# Patient Record
Sex: Male | Born: 1995 | Race: White | Hispanic: No | Marital: Single | Smoking: Never smoker
Health system: Southern US, Community
[De-identification: ages and names within clinical notes are randomized; demographics above are authoritative.]

## PROBLEM LIST (undated history)

## (undated) DIAGNOSIS — M259 Joint disorder, unspecified: Secondary | ICD-10-CM

## (undated) DIAGNOSIS — K219 Gastro-esophageal reflux disease without esophagitis: Secondary | ICD-10-CM

---
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
## 2016-04-04 ENCOUNTER — Ambulatory Visit (INDEPENDENT_AMBULATORY_CARE_PROVIDER_SITE_OTHER)

## 2016-04-04 ENCOUNTER — Ambulatory Visit
Admission: EM | Admit: 2016-04-04 | Discharge: 2016-04-04 | Disposition: A | Discharge status: Home / Self Care | Attending: Family Medicine | Admitting: Family Medicine

## 2016-04-04 DIAGNOSIS — R52 Pain, unspecified: Secondary | ICD-10-CM

## 2016-04-04 MED ORDER — NAPROXEN 500 MG PO TABS: 500.0000 mg | ORAL_TABLET | Freq: Two times a day (BID) | ORAL | Status: AC

## 2016-04-04 NOTE — ED Notes (Signed)
Patient complains of right leg pain. Patient states that he has shin bone pain. Patient states that he got cleated initially while playing soccer and then had leg to leg contact injury around 2 weeks ago. Patient states that he has tried advil and ice without relief. Patient reports some discomfort with ambulation as well as with touch.

## 2016-04-04 NOTE — ED Provider Notes (Signed)
CSN: [id number]     Arrival date & time 04/04/16  1018 History   First MD Initiated Contact with Patient 04/04/16 1233     Chief Complaint  Patient presents with  . Leg Pain   (Consider location/radiation/quality/duration/timing/severity/associated sxs/prior Treatment) HPI: Patient presents today with symptoms of right leg pain. Patient states that he was cleated below the knee playing soccer about 2 weeks ago. He then got cleated there again a week later. He has taken Advil once and used ice on the area. He denies the area ever being significantly swollen or bruised. He denies any abrasions or redness of the area. He denies any fever or chills. He does state that he has some discomfort and tightness with flexion at the knee and also with ambulation at times. He denies any other injury anywhere else. He denies any knee effusion. He denies any knee injury or surgery in the past.  Past Medical History  Diagnosis Date  . Wrist disorder    Past Surgical History  Procedure Laterality Date  . Wrist surgery Left 2013   History reviewed. No pertinent family history. Social History  Substance Use Topics  . Smoking status: Never Smoker   . Smokeless tobacco: None  . Alcohol Use: No    Review of Systems: Negative except mentioned above.   Allergies  Review of patient's allergies indicates no known allergies.  Home Medications   Prior to Admission medications   Medication Sig Start Date End Date Taking? Authorizing Provider  esomeprazole (NEXIUM) 40 MG capsule Take 40 mg by mouth daily at 12 noon.   Yes Historical Provider, MD  ondansetron (ZOFRAN ODT) 8 MG disintegrating tablet Take 1 tablet (8 mg total) by mouth every 8 (eight) hours as needed for nausea or vomiting. 12/01/15   Payton Mccallumrlando Conty, MD  oseltamivir (TAMIFLU) 75 MG capsule Take 1 capsule (75 mg total) by mouth 2 (two) times daily. 12/01/15   Payton Mccallumrlando Conty, MD   Meds Ordered and Administered this Visit  Medications - No data to  display  BP 119/67 mmHg  Pulse 80  Temp(Src) 97.2 F (36.2 C) (Tympanic)  Resp 16  Ht 5\' 7"  (1.702 m)  Wt 180 lb (81.647 kg)  BMI 28.19 kg/m2  SpO2 99% No data found.   Physical Exam:   GENERAL: NAD HEENT: no pharyngeal erythema, no exudate RESP: CTA B CARD: RRR MSK: no obvious deformity or discoloration, tenderness localized to proximal tibia area laterally and medially to palpation, patella tendon intact, mild tenderness at pes anserine bursa, no knee effusion, -Lachman, -McMurray, no laxity with varus or valgus stress, nv intact  NEURO: CN II-XII grossly intact   ED Course  Procedures (including critical care time)  Labs Review Labs Reviewed - No data to display  Imaging Review No results found.   MDM  A/P: Right leg pain - x-ray negative for fracture, discussed with patient and parent that symptoms could still be related to a bone bruise, subtle fracture, etc bursitis. I have ordered an MRI as an outpatient and told the patient that hopefully we can do this before he leaves on a trip outside of the country next week. If this is unable to be ordered through the urgent care as an outpatient order patient will have to see his primary care physician or orthopedics to have this imaging done. Ice area frequently, NSAIDs when necessary, seek medical attention for any acute worsening symptoms.  Jolene ProvostKirtida Traevon Meiring, MD 04/04/16 1339

## 2016-04-07 ENCOUNTER — Other Ambulatory Visit: Payer: Self-pay | Admitting: Orthopedic Surgery

## 2016-04-07 DIAGNOSIS — M7989 Other specified soft tissue disorders: Secondary | ICD-10-CM

## 2016-04-07 DIAGNOSIS — R29898 Other symptoms and signs involving the musculoskeletal system: Secondary | ICD-10-CM

## 2016-04-07 DIAGNOSIS — S8001XA Contusion of right knee, initial encounter: Secondary | ICD-10-CM

## 2016-04-07 DIAGNOSIS — M7651 Patellar tendinitis, right knee: Secondary | ICD-10-CM

## 2016-05-24 ENCOUNTER — Ambulatory Visit
Admission: EM | Admit: 2016-05-24 | Discharge: 2016-05-24 | Disposition: A | Discharge status: Home / Self Care | Attending: Family Medicine | Admitting: Family Medicine

## 2016-05-24 ENCOUNTER — Encounter: Payer: Self-pay | Admitting: *Deleted

## 2016-05-24 ENCOUNTER — Ambulatory Visit (INDEPENDENT_AMBULATORY_CARE_PROVIDER_SITE_OTHER)

## 2016-05-24 DIAGNOSIS — S92411A Displaced fracture of proximal phalanx of right great toe, initial encounter for closed fracture: Secondary | ICD-10-CM

## 2016-05-24 DIAGNOSIS — S90121A Contusion of right lesser toe(s) without damage to nail, initial encounter: Secondary | ICD-10-CM

## 2016-05-24 HISTORY — DX: Gastro-esophageal reflux disease without esophagitis: K21.9

## 2016-05-24 MED ORDER — OXYCODONE-ACETAMINOPHEN 5-325 MG PO TABS: 1.0000 | ORAL_TABLET | Freq: Three times a day (TID) | ORAL | 0 refills | Status: AC | PRN

## 2016-05-24 NOTE — ED Provider Notes (Signed)
[facility] ____________________________________________  Time seen: Approximately 245 PM  I have reviewed the triage vital signs and the nursing notes.   HISTORY  Chief Complaint Foot Pain   HPI Bryan Page is a 20 y.o. male presents with a complaint of right foot pain post injury yesterday morning. Patient reports he was playing soccer and another player accidentally stepped directly on his right distal foot causing pain to his right great toe. Patient reports pain has persisted. Patient reports no pain if at rest but reports as soon as he tries to apply weight pain is present. Patient reports pain is present to big toe and top of his foot.  Denies pain radiation. Denies any numbness or tingling sensation. Patient reports he has been using crutches at home. Denies head injury or loss of consciousness.  Denies headache, neck pain, back pain, other extremity pain or other extremity injury. Patient reports feels well otherwise.   [facility], MD: PCP    Past Medical History:  Diagnosis Date  . Acid reflux   . Wrist disorder     There are no active problems to display for this patient.   Past Surgical History:  Procedure Laterality Date  . WRIST SURGERY Left 2013    No current facility-administered medications for this encounter.   Current Outpatient Prescriptions:  .  esomeprazole (NEXIUM) 40 MG capsule, Take 40 mg by mouth daily at 12 noon., Disp: , Rfl:  .  naproxen (NAPROSYN) 500 MG tablet, Take 1 tablet (500 mg total) by mouth 2 (two) times daily., Disp: 20 tablet, Rfl: 0 .  ondansetron (ZOFRAN ODT) 8 MG disintegrating tablet, Take 1 tablet (8 mg total) by mouth every 8 (eight) hours as needed for nausea or vomiting., Disp: 6 tablet, Rfl: 0 .  oseltamivir (TAMIFLU) 75 MG capsule, Take 1 capsule (75 mg total) by mouth 2 (two) times daily., Disp: 10 capsule, Rfl: 0 .  oxyCODONE-acetaminophen (ROXICET) 5-325 MG tablet, Take 1 tablet by  mouth every 8 (eight) hours as needed for moderate pain or severe pain (Do not drive or operate heavy machinery while taking as can cause drowsiness.)., Disp: 9 tablet, Rfl: 0  Allergies Review of patient's allergies indicates no known allergies.  History reviewed. No pertinent family history.  Social History Social History  Substance Use Topics  . Smoking status: Never Smoker  . Smokeless tobacco: Never Used  . Alcohol use No    Review of Systems Constitutional: No fever/chills Eyes: No visual changes. ENT: No sore throat. Cardiovascular: Denies chest pain. Respiratory: Denies shortness of breath. Gastrointestinal: No abdominal pain.  No nausea, no vomiting.  No diarrhea.  No constipation. Genitourinary: Negative for dysuria. Musculoskeletal: Negative for back pain. As above.  Skin: Negative for rash. Neurological: Negative for headaches, focal weakness or numbness.  10-point ROS otherwise negative.  ____________________________________________   PHYSICAL EXAM:  VITAL SIGNS: ED Triage Vitals  Enc Vitals Group     BP 05/24/16 1306 106/67     Pulse Rate 05/24/16 1306 87     Resp 05/24/16 1306 18     Temp 05/24/16 1306 98.1 F (36.7 C)     Temp Source 05/24/16 1306 Oral     SpO2 05/24/16 1306 100 %     Weight 05/24/16 1308 180 lb (81.6 kg)     Height 05/24/16 1308 5\' 7"  (1.702 m)     Head Circumference --      Peak Flow --      Pain Score  05/24/16 1313 10     Pain Loc --      Pain Edu? --      Excl. in GC? --     Constitutional: Alert and oriented. Well appearing and in no acute distress. Eyes: Conjunctivae are normal. PERRL. EOMI. ENT      Head: Normocephalic and atraumatic.      Mouth/Throat: Mucous membranes are moist. Cardiovascular: Normal rate, regular rhythm. Grossly normal heart sounds.  Good peripheral circulation. Respiratory: Normal respiratory effort without tachypnea nor retractions. Breath sounds are clear and equal bilaterally. No  wheezes/rales/rhonchi.. Gastrointestinal: Soft and nontender.  Musculoskeletal:  Nontender with normal range of motion in all extremities. No midline cervical, thoracic or lumbar tenderness to palpation. Bilateral pedal pulses equal and easily palpated. Except: right medial foot at proximal first phalanx moderate swelling and ecchymosis present, mild tenderness and swelling at distal second and third metatarsals, right foot otherwise nontender, no pain with right ankle rotation, mild dorsal pain with right foot plantar flexion and dorsiflexion, distal sensation intact, normal distal capillary refill to all right foot distal toes, right lower extremity otherwise nontender. Gait not tested due to pain. Neurologic:  Normal speech and language. No gross focal neurologic deficits are appreciated. Speech is normal.   Skin:  Skin is warm, dry and intact. No rash noted. Psychiatric: Mood and affect are normal. Speech and behavior are normal. Patient exhibits appropriate insight and judgment   ___________________________________________   LABS (all labs ordered are listed, but only abnormal results are displayed)  Labs Reviewed - No data to display  RADIOLOGY  Dg Foot Complete Right  Result Date: 05/24/2016 CLINICAL DATA:  Pain following injury while playing soccer. Cleat penetrated foot EXAM: RIGHT FOOT COMPLETE - 3+ VIEW COMPARISON:  None. FINDINGS: Frontal, oblique, and lateral views were obtained. There is a comminuted fracture along the proximal aspect of the first proximal phalanx with fracture fragments extending into the first MTP joint medially. No other fracture evident. No dislocation. There is soft tissue swelling over the dorsum of the mid the distal foot. No erosive change. IMPRESSION: Comminuted fracture involving the proximal aspect of the first proximal phalanx with several a avulsed fragments medially. Fracture fragments extend of the first MTP joint. No other fracture. No dislocation.  Soft tissue swelling over the dorsal mid to distal foot. Electronically Signed   By: Bretta Bang III M.D.   On: 05/24/2016 13:58   ____________________________________________   PROCEDURES Procedures   Reports has crutches at home. Postoperative shoe applied. Neurovascular intact post application. __________________________________________   INITIAL IMPRESSION / ASSESSMENT AND PLAN / ED COURSE  Pertinent labs & imaging results that were available during my care of the patient were reviewed by me and considered in my medical decision making (see chart for details).  Well-appearing patient. No acute distress. Sitting for the complaints of right foot pain post mechanical injury yesterday when a another soccer player stepped with cleats onto his right foot. Will evaluate x-ray. Denies any other injury.  Per radiologist right foot comminuted fracture involving the proximal aspect of the first phalanx with several avulsed fragments medially, fracture fragments extend to the first MTP joint no other fracture seen, no dislocation, soft tissue swelling. Discussed in detail with patient and father at bedside, concern regarding need to follow-up with podiatry for possible further intervention. Patient reports he has crutches at home. Discussed in detail importance of no weightbearing. Patient states that he will not apply weight and agrees to wear postoperative shoe instead  of splinting due to comfort but states he will not apply weight. Encourage podiatry appointment in 2-3 days. Information given for podiatry. Prescription given for Percocet as needed for breakthrough pain. Encourage over-the-counter ibuprofen, ice, rest and nonweightbearing. Work note given for today and tomorrow.   Discussed follow up with Primary care physician this week. Discussed follow up and return parameters including no resolution or any worsening concerns. Patient verbalized understanding and agreed to plan.    ____________________________________________   FINAL CLINICAL IMPRESSION(S) / ED DIAGNOSES  Final diagnoses:  Closed displaced fracture of proximal phalanx of great toe, right, initial encounter  Contusion of fifth toe of right foot, initial encounter     Discharge Medication List as of 05/24/2016  2:36 PM    START taking these medications   Details  oxyCODONE-acetaminophen (ROXICET) 5-325 MG tablet Take 1 tablet by mouth every 8 (eight) hours as needed for moderate pain or severe pain (Do not drive or operate heavy machinery while taking as can cause drowsiness.)., Starting Mon 05/24/2016, Print        Note: This dictation was prepared with Dragon dictation along with smaller phrase technology. Any transcriptional errors that result from this process are unintentional.    Clinical Course      Renford Dills, NP 05/24/16 1758

## 2016-05-24 NOTE — ED Triage Notes (Signed)
Patient injured his right foot while playing soccer last AM. Swelling is visible on top of right foot with bruising around right big toe.

## 2016-05-24 NOTE — Discharge Instructions (Signed)
Take medication as prescribed. Take over the counter ibuprofen as needed. Apply ice and elevate.Rest. Drink plenty of fluids. No weight wearing. Use crutches.   Follow up with podiatry this week as discussed.    Follow up with your primary care physician this week as needed. Return to Urgent care for new or worsening concerns.

## 2016-06-08 ENCOUNTER — Ambulatory Visit (INDEPENDENT_AMBULATORY_CARE_PROVIDER_SITE_OTHER): Admitting: Gastroenterology

## 2016-06-08 ENCOUNTER — Other Ambulatory Visit: Payer: Self-pay

## 2016-06-08 ENCOUNTER — Encounter: Payer: Self-pay | Admitting: Gastroenterology

## 2016-06-08 VITALS — BP 114/78 | HR 90 | Temp 98.2°F | Ht 67.0 in | Wt 178.0 lb

## 2016-06-08 DIAGNOSIS — IMO0002 Reserved for concepts with insufficient information to code with codable children: Secondary | ICD-10-CM | POA: Insufficient documentation

## 2016-06-08 DIAGNOSIS — K219 Gastro-esophageal reflux disease without esophagitis: Secondary | ICD-10-CM

## 2016-06-08 DIAGNOSIS — E739 Lactose intolerance, unspecified: Secondary | ICD-10-CM | POA: Insufficient documentation

## 2016-06-08 MED ORDER — PANTOPRAZOLE SODIUM 40 MG PO TBEC: 40.0000 mg | DELAYED_RELEASE_TABLET | Freq: Every day | ORAL | 3 refills | Status: DC

## 2016-06-08 NOTE — Progress Notes (Signed)
Gastroenterology Consultation  Referring Provider:     Marina Goodell, MD Primary Care Physician:  Bryan Page, Bryan Guthrie, MD Primary Gastroenterologist:  Dr. Servando Snare     Reason for Consultation:     GERD        HPI:   Bryan Page is a 20 y.o. y/o male referred for consultation & management of GERD and establishing care by Bryan Page, Bryan Guthrie, MD.  This patient comes in today with a history of heartburn.  The patient was being treated with Nexium and states that his symptoms were well-controlled.  The patient denies any dysphagia nausea vomiting fevers or chills.  The patient states that when he misses a dose of his medication he usually feels that the night after that.  The patient  Has not had any black stools or bloody stools.  He was seen in the past by Dr. Bluford Kaufmann who since left town and now is here to establish care with me.  Past Medical History:  Diagnosis Date  . Acid reflux   . Wrist disorder     Past Surgical History:  Procedure Laterality Date  . WRIST SURGERY Left 2013    Prior to Admission medications   Medication Sig Start Date End Date Taking? Authorizing Provider  Ascorbic Acid (VITAMIN C) 1000 MG tablet Take by mouth.   Yes Historical Provider, MD  esomeprazole (NEXIUM) 40 MG capsule Take 40 mg by mouth daily at 12 noon.   Yes Historical Provider, MD  Multiple Vitamin (MULTIVITAMIN) capsule Take by mouth.   Yes Historical Provider, MD  naproxen (NAPROSYN) 500 MG tablet Take 1 tablet (500 mg total) by mouth 2 (two) times daily. 04/04/16  Yes Jolene Provost, MD  ondansetron (ZOFRAN ODT) 8 MG disintegrating tablet Take 1 tablet (8 mg total) by mouth every 8 (eight) hours as needed for nausea or vomiting. 12/01/15  Yes Payton Mccallum, MD  oxyCODONE-acetaminophen (ROXICET) 5-325 MG tablet Take 1 tablet by mouth every 8 (eight) hours as needed for moderate pain or severe pain (Do not drive or operate heavy machinery while taking as can cause drowsiness.). 05/24/16  Yes  Renford Dills, NP  pantoprazole (PROTONIX) 40 MG tablet Take 1 tablet (40 mg total) by mouth daily. 06/08/16   Midge Minium, MD    History reviewed. No pertinent family history.   Social History  Substance Use Topics  . Smoking status: Never Smoker  . Smokeless tobacco: Never Used  . Alcohol use No    Allergies as of 06/08/2016  . (No Known Allergies)    Review of Systems:    All systems reviewed and negative except where noted in HPI.   Physical Exam:  BP 114/78   Pulse 90   Temp 98.2 F (36.8 C) (Oral)   Ht 5\' 7"  (1.702 m)   Wt 178 lb (80.7 kg)   BMI 27.88 kg/m  No LMP for male patient. Psych:  Alert and cooperative. Normal mood and affect. General:   Alert,  Well-developed, well-nourished, pleasant and cooperative in NAD Head:  Normocephalic and atraumatic. Eyes:  Sclera clear, no icterus.   Conjunctiva pink. Ears:  Normal auditory acuity. Nose:  No deformity, discharge, or lesions. Mouth:  No deformity or lesions,oropharynx pink & moist. Neck:  Supple; no masses or thyromegaly. Lungs:  Respirations even and unlabored.  Clear throughout to auscultation.   No wheezes, crackles, or rhonchi. No acute distress. Heart:  Regular rate and rhythm; no murmurs, clicks, rubs, or gallops. Abdomen:  Normal bowel sounds.  No bruits.  Soft, non-tender and non-distended without masses, hepatosplenomegaly or hernias noted.  No guarding or rebound tenderness.  Negative Carnett sign.   Rectal:  Deferred.  Msk:  Symmetrical without gross deformities.  Good, equal movement & strength bilaterally. Pulses:  Normal pulses noted. Extremities:  No clubbing or edema.  No cyanosis. Neurologic:  Alert and oriented x3;  grossly normal neurologically. Skin:  Intact without significant lesions or rashes.  No jaundice. Lymph Nodes:  No significant cervical adenopathy. Psych:  Alert and cooperative. Normal mood and affect.  Imaging Studies: Dg Foot Complete Right  Result Date:  05/24/2016 CLINICAL DATA:  Pain following injury while playing soccer. Cleat penetrated foot EXAM: RIGHT FOOT COMPLETE - 3+ VIEW COMPARISON:  None. FINDINGS: Frontal, oblique, and lateral views were obtained. There is a comminuted fracture along the proximal aspect of the first proximal phalanx with fracture fragments extending into the first MTP joint medially. No other fracture evident. No dislocation. There is soft tissue swelling over the dorsum of the mid the distal foot. No erosive change. IMPRESSION: Comminuted fracture involving the proximal aspect of the first proximal phalanx with several a avulsed fragments medially. Fracture fragments extend of the first MTP joint. No other fracture. No dislocation. Soft tissue swelling over the dorsal mid to distal foot. Electronically Signed   By: Bretta BangWilliam  Woodruff III M.D.   On: 05/24/2016 13:58    Assessment and Plan:   Dalene CarrowChristopher D Salo is a 20 y.o. y/o male who has a history of GERD well-controlled by his Nexium.  The patient has been informed by his insurance provider that the Nexium is no longer covered.  The patient will be started on pantoprazole 40 mg a day.  The patient will be given a 3 month supply with 3 refills.The patient has been explained the plan and agrees with it.   Note: This dictation was prepared with Dragon dictation along with smaller phrase technology. Any transcriptional errors that result from this process are unintentional.

## 2016-08-23 ENCOUNTER — Telehealth: Payer: Self-pay | Admitting: Gastroenterology

## 2016-08-23 NOTE — Telephone Encounter (Signed)
Pt's mother will find out which medications [employer] will cover and call me back to let me know.

## 2016-08-23 NOTE — Telephone Encounter (Signed)
Patient mother call, Bryan CampanileSandy, and stated that the new medication that patient is taking isn't working as well as the nexium. She doesn't know what it's called. He is getting a burning in his throat and down to his stomach. [facility] doesn't cover nexium.

## 2016-12-28 ENCOUNTER — Other Ambulatory Visit: Payer: Self-pay | Admitting: Orthopedic Surgery

## 2016-12-28 DIAGNOSIS — M25512 Pain in left shoulder: Secondary | ICD-10-CM

## 2017-01-14 ENCOUNTER — Ambulatory Visit
Admission: RE | Admit: 2017-01-14 | Discharge: 2017-01-14 | Disposition: A | Discharge status: Home / Self Care | Source: Ambulatory Visit | Attending: Orthopedic Surgery | Admitting: Orthopedic Surgery

## 2017-01-14 DIAGNOSIS — S43432A Superior glenoid labrum lesion of left shoulder, initial encounter: Secondary | ICD-10-CM | POA: Insufficient documentation

## 2017-01-14 DIAGNOSIS — M25512 Pain in left shoulder: Secondary | ICD-10-CM

## 2017-01-14 MED ORDER — IOPAMIDOL (ISOVUE-200) INJECTION 41%
15.0000 mL | Freq: Once | INTRAVENOUS | Status: AC
  Administered 2017-01-14: 15 mL via INTRAVENOUS
  Filled 2017-01-14: qty 15

## 2017-01-14 MED ORDER — GADOBENATE DIMEGLUMINE 529 MG/ML IV SOLN
0.0500 mL | Freq: Once | INTRAVENOUS | Status: AC | PRN
  Administered 2017-01-14: 0.05 mL via INTRA_ARTICULAR

## 2017-01-14 MED ORDER — LIDOCAINE HCL (PF) 1 % IJ SOLN
5.0000 mL | Freq: Once | INTRAMUSCULAR | Status: AC
  Administered 2017-01-14: 5 mL
  Filled 2017-01-14: qty 5

## 2017-04-11 ENCOUNTER — Ambulatory Visit: Admitting: Gastroenterology

## 2017-05-06 ENCOUNTER — Other Ambulatory Visit: Payer: Self-pay

## 2017-05-06 MED ORDER — PANTOPRAZOLE SODIUM 40 MG PO TBEC: 40.0000 mg | DELAYED_RELEASE_TABLET | Freq: Every day | ORAL | 3 refills | Status: AC

## 2017-05-16 ENCOUNTER — Other Ambulatory Visit: Payer: Self-pay

## 2017-05-16 MED ORDER — DEXLANSOPRAZOLE 60 MG PO CPDR: 60.0000 mg | DELAYED_RELEASE_CAPSULE | Freq: Every day | ORAL | 3 refills | Status: DC

## 2018-06-08 IMAGING — RF DG FLUORO GUIDE NDL PLC/BX
1 series · 1 of 1 positions shown · non-contrast
Comparison: none

CLINICAL DATA: Acute pain.

[Series 1: fluoro_arthrogram_singleshot_bw · 0.17mm/px · 1 of 1 slices shown]
[im 1/1]
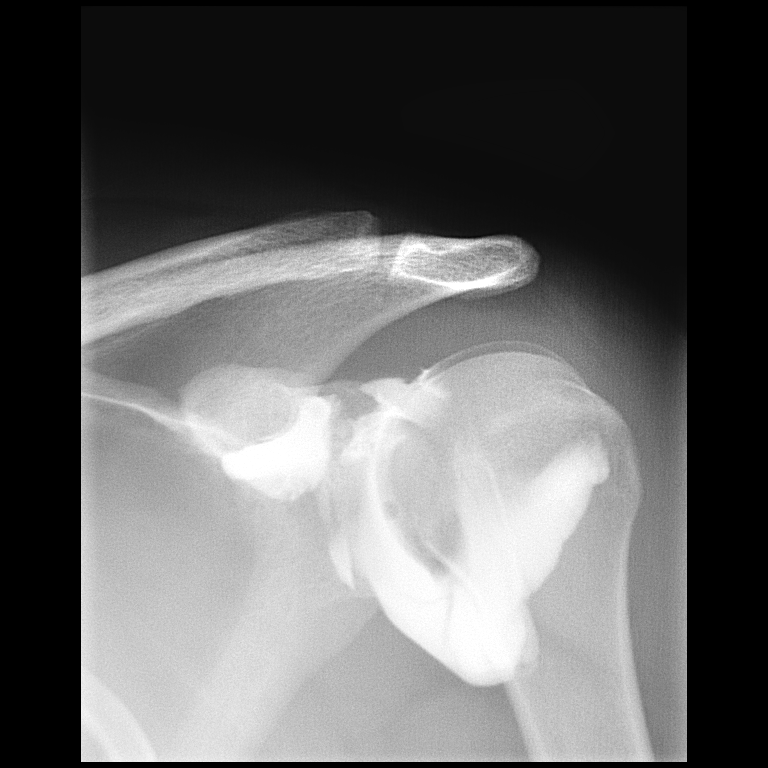

[1 of 1 positions shown; findings below may reference images not displayed]

EXAM:
LEFT SHOULDER INJECTION UNDER FLUOROSCOPY

FLUOROSCOPY TIME:  Fluoroscopy Time:  2 minutes 0 seconds

Radiation Exposure Index (if provided by the fluoroscopic device):
22.0

Number of Acquired Spot Images: 1

PROCEDURE:
After discussing the risks and benefits of this procedure the
patient informed consent was obtained. Left shoulder sterilely
prepped and draped. Following local anesthesia with 1% lidocaine 22
gauge needle was advanced into the left shoulder and standardized
mixture of Isovue 200, 1% lidocaine lidocaine, and Magnevist
administered. No complications.
IMPRESSION: Successful left shoulder injection for MRI arthrography.

## 2018-06-19 ENCOUNTER — Other Ambulatory Visit: Payer: Self-pay | Admitting: Gastroenterology

## 2018-07-10 ENCOUNTER — Telehealth: Payer: Self-pay | Admitting: Gastroenterology

## 2018-07-10 ENCOUNTER — Other Ambulatory Visit: Payer: Self-pay

## 2018-07-10 NOTE — Telephone Encounter (Signed)
Patients mom called Dirk Dress) and has requested that we send another refill for Dexilant 60mg  to [facility]([phone]) until he can see the new MD in [place]. She states the Pharmacy sent a request.

## 2018-07-10 NOTE — Telephone Encounter (Signed)
Advised pt's mother, Dois Davenport, I have already sent rx for Dexilant 60mg  [facility] on 06/19/18 and receipt was confirmed. I advised her I also received information back from the pharmacy that the pt had filled a rx for Pantoprazole and was not able to fill the Dexilant. She doesn't understand that and will [facility] to clarify.
